# Patient Record
Sex: Male | Born: 1955 | Race: White | Hispanic: No | Marital: Married | Smoking: Former smoker
Health system: Southern US, Community
[De-identification: ages and names within clinical notes are randomized; demographics above are authoritative.]

## PROBLEM LIST (undated history)

## (undated) DIAGNOSIS — I10 Essential (primary) hypertension: Secondary | ICD-10-CM

## (undated) DIAGNOSIS — G4700 Insomnia, unspecified: Secondary | ICD-10-CM

## (undated) DIAGNOSIS — E039 Hypothyroidism, unspecified: Secondary | ICD-10-CM

## (undated) DIAGNOSIS — E7800 Pure hypercholesterolemia, unspecified: Secondary | ICD-10-CM

## (undated) DIAGNOSIS — N400 Enlarged prostate without lower urinary tract symptoms: Secondary | ICD-10-CM

## (undated) HISTORY — DX: Insomnia, unspecified: G47.00

## (undated) HISTORY — DX: Benign prostatic hyperplasia without lower urinary tract symptoms: N40.0

## (undated) HISTORY — DX: Essential (primary) hypertension: I10

## (undated) HISTORY — DX: Hypothyroidism, unspecified: E03.9

## (undated) HISTORY — DX: Pure hypercholesterolemia, unspecified: E78.00

## (undated) HISTORY — PX: OTHER SURGICAL HISTORY: SHX169

## (undated) HISTORY — PX: INCISION AND DRAINAGE ABSCESS ANAL: SUR669

---

## 1988-12-27 HISTORY — PX: BACK SURGERY: SHX140

## 2007-02-14 ENCOUNTER — Ambulatory Visit (HOSPITAL_COMMUNITY): Admission: RE | Admit: 2007-02-14 | Discharge: 2007-02-14 | Payer: Self-pay | Admitting: Family Medicine

## 2008-02-06 ENCOUNTER — Ambulatory Visit (HOSPITAL_COMMUNITY): Admission: RE | Admit: 2008-02-06 | Discharge: 2008-02-06 | Payer: Self-pay | Admitting: General Surgery

## 2011-05-11 NOTE — H&P (Signed)
NAME:  Arthur Williamson, Arthur Williamson NO.:  [id number]   MEDICAL RECORD NO.:  [id number]           PATIENT TYPE:  AMB   LOCATION:  DAY                           FACILITY:  [facility]   PHYSICIAN:  Dalia Heading, M.D.  DATE OF BIRTH:  [birth year]   DATE OF ADMISSION:  DATE OF DISCHARGE:  LH                              HISTORY & PHYSICAL   CHIEF COMPLAINT:  Need for screening colonoscopy.   HISTORY OF PRESENT ILLNESS:  The patient is a 55 year old white male who  is referred for endoscopic evaluation.  Needs colonoscopy for screening  purposes.  No abdominal pain, weight loss, nausea, vomiting, diarrhea,  constipation, melena, hematochezia have been noted.  He has never had a  colonoscopy.  There is no family history of colon carcinoma.   PAST MEDICAL HISTORY:  Includes hypertension.   PAST SURGICAL HISTORY:  Pyloric stenosis, disk surgery, perianal  abscess.   CURRENT MEDICATIONS:  Benicar, simvastatin, Celebrex.   ALLERGIES:  No known drug allergies.   REVIEW OF SYSTEMS:  Noncontributory.   PHYSICAL EXAMINATION:  The patient is a well-developed, well-nourished,  white male in no acute distress.  LUNGS:  Clear to auscultation with equal breath sounds bilaterally.  HEART:  Examination reveals regular rate and rhythm without S3, S4, or  murmurs.  ABDOMEN:  Is soft, nontender, nondistended.  No hepatosplenomegaly or  masses are noted.  RECTAL EXAMINATION:  Was deferred to the procedure.   IMPRESSION:  Need for screening colonoscopy.   PLAN:  The patient is scheduled for colonoscopy on February 06, 2008.  Risks and benefits of the procedure including bleeding and perforation  were fully explained to the patient, who gave informed consent.      Dalia Heading, M.D.  Electronically Signed     MAJ/MEDQ  D:  01/25/2008  T:  01/25/2008  Job:  [id number]   cc:   Patrica Duel, M.D.  Fax: [phone]   Dalia Heading, M.D.  Fax: [phone]   [facility]  Fax:  [phone]

## 2019-03-08 ENCOUNTER — Encounter: Payer: Self-pay | Admitting: Internal Medicine

## 2019-05-01 DIAGNOSIS — Z1389 Encounter for screening for other disorder: Secondary | ICD-10-CM | POA: Diagnosis not present

## 2019-05-01 DIAGNOSIS — E063 Autoimmune thyroiditis: Secondary | ICD-10-CM | POA: Diagnosis not present

## 2019-05-01 DIAGNOSIS — Z6832 Body mass index (BMI) 32.0-32.9, adult: Secondary | ICD-10-CM | POA: Diagnosis not present

## 2019-05-01 DIAGNOSIS — E6609 Other obesity due to excess calories: Secondary | ICD-10-CM | POA: Diagnosis not present

## 2019-05-14 ENCOUNTER — Ambulatory Visit (INDEPENDENT_AMBULATORY_CARE_PROVIDER_SITE_OTHER): Payer: Medicare Other | Admitting: Nurse Practitioner

## 2019-05-14 ENCOUNTER — Other Ambulatory Visit: Payer: Self-pay

## 2019-05-14 ENCOUNTER — Telehealth: Payer: Self-pay | Admitting: *Deleted

## 2019-05-14 ENCOUNTER — Encounter: Payer: Self-pay | Admitting: Nurse Practitioner

## 2019-05-14 DIAGNOSIS — Z1211 Encounter for screening for malignant neoplasm of colon: Secondary | ICD-10-CM | POA: Diagnosis not present

## 2019-05-14 DIAGNOSIS — Z0000 Encounter for general adult medical examination without abnormal findings: Secondary | ICD-10-CM | POA: Insufficient documentation

## 2019-05-14 NOTE — Telephone Encounter (Signed)
LMOVM to schedule TCS w/ RMR with propofol in July per pt request

## 2019-05-14 NOTE — Patient Instructions (Signed)
Your health issues we discussed today were:   Need for colonoscopy: 1. We will schedule your colonoscopy for you.  I will let the nursing staff know you prefer to wait till July 2. As we discussed you will be tested for COVID-19/coronavirus prior to your procedure. 3. After your COVID-19/coronavirus testing you will likely need to stay at home until your procedure 4. Further recommendations will follow your colonoscopy  Overall I recommend:  1. Call us if you have any questions or concerns 2. Return for follow-up based on recommendations made after your colonoscopy, or as needed for GI problems 3. Continue your other medications.   Because of recent events of COVID-19 ("Coronavirus"), follow CDC recommendations:  1. Wash your hand frequently 2. Avoid touching your face 3. Stay away from people who are sick 4. If you have symptoms such as fever, cough, shortness of breath then call your healthcare provider for further guidance 5. If you are sick, STAY AT HOME unless otherwise directed by your healthcare provider. 6. Follow directions from state and national officials regarding staying safe   At [facility] we value your feedback. You may receive a survey about your visit today. Please share your experience as we strive to create trusting relationships with our patients to provide genuine, compassionate, quality care.  We appreciate your understanding and patience as we review any laboratory studies, imaging, and other diagnostic tests that are ordered as we care for you. Our office policy is 5 business days for review of these results, and any emergent or urgent results are addressed in a timely manner for your best interest. If you do not hear from our office in 1 week, please contact us.   We also encourage the use of MyChart, which contains your medical information for your review as well. If you are not enrolled in this feature, an access code is on this after visit  summary for your convenience. Thank you for allowing [place] to be involved in your care.  It was great to see you today!  I hope you have a great day!!

## 2019-05-14 NOTE — Assessment & Plan Note (Addendum)
The patient was referred by primary care for colonoscopy.  His last colonoscopy was 12 years ago and recommended 10-year repeat.  He has held off due to wanting to wait till Medicare eligibility.  He is currently asymptomatic from GI standpoint.  We will proceed with colonoscopy at this time.  I discussed with him COVID-19 testing prior to his procedure and the likely need for him to quarantine.  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  Given backlog of elective cases it should not be an issue.  Return for follow-up based on recommendations made after his procedure.  Proceed with TCS on propofol/MAC with Dr. Gala Romney in near future: the risks, benefits, and alternatives have been discussed with the patient in detail. The patient states understanding and desires to proceed.  The patient is currently on Xanax.  No other anticoagulants, anxiolytics, chronic pain medications, or antidepressants.  He drinks about 6 beers a day and smokes marijuana once a day.  Given his alcohol and marijuana use as well as his daily Xanax we will plan for the procedure on propofol/MAC to promote adequate sedation.

## 2019-05-14 NOTE — Progress Notes (Signed)
Referring Provider: Redmond School, MD Primary Care Physician:  Redmond School, MD Primary GI:  Dr. Gala Romney  NOTE: Service was provided via telemedicine and was requested by the patient due to COVID-19 pandemic.  Method of visit: Doxy.Me  Patient Location: Home  Provider Location: Office  Reason for Phone Visit: PCP Referral  The patient was consented to phone follow-up via telephone encounter including billing of the encounter (yes/no): Yes  Persons present on the phone encounter, with roles: Wife  Total time (minutes) spent on medical discussion: 18 minutes  Chief Complaint  Patient presents with  . Colonoscopy    last tcs approx 12 yrs ago by Dr. Arnoldo Morale; doing ok    HPI:   Arthur Williamson is a 63 y.o. male who presents for virtual visit regarding: On referral from PCP for colonoscopy.  Nurse/phone triage was deferred due to medications likely necessitating augmented sedation.  No history of colonoscopy found in our system.  Today he states he is doing well overall.  His last colonoscopy was 12 years ago by Dr. Arnoldo Morale with surgery.  Recommended 10-year repeat colonoscopy but he put it off waiting until he became Medicare eligible.  Denies abdominal pain, N/V, hematochezia, melena, fever, chills, unintentional weight loss.  Denies URI and flu-like symptoms.  Denies loss of sense of taste or smell. Denies chest pain, dyspnea, dizziness, lightheadedness, syncope, near syncope. Denies any other upper or lower GI symptoms.   Past Medical History:  Diagnosis Date  . BPH (benign prostatic hyperplasia)   . Hypercholesterolemia   . Hypertension   . Hypothyroidism   . Insomnia     Past Surgical History:  Procedure Laterality Date  . BACK SURGERY  1990  . INCISION AND DRAINAGE ABSCESS ANAL     perianal abscess drainage  . PYLORIC VALVE SURGERY     As an infant    Current Outpatient Medications  Medication Sig Dispense Refill  . ALPRAZolam (XANAX) 0.5 MG tablet  Take 1 tablet by mouth at bedtime.    Marland Kitchen levothyroxine (SYNTHROID) 75 MCG tablet Take 75 mcg by mouth daily before breakfast.    . losartan (COZAAR) 100 MG tablet Take 1 tablet by mouth daily.    . simvastatin (ZOCOR) 20 MG tablet Take 1 tablet by mouth daily.    . tamsulosin (FLOMAX) 0.4 MG CAPS capsule Take 1 capsule by mouth daily.     No current facility-administered medications for this visit.     Allergies as of 05/14/2019  . (No Known Allergies)    Family History  Problem Relation Age of Onset  . Colon cancer Neg Hx     Social History   Socioeconomic History  . Marital status: Married    Spouse name: Not on file  . Number of children: Not on file  . Years of education: Not on file  . Highest education level: Not on file  Occupational History  . Not on file  Social Needs  . Financial resource strain: Not on file  . Food insecurity:    Worry: Not on file    Inability: Not on file  . Transportation needs:    Medical: Not on file    Non-medical: Not on file  Tobacco Use  . Smoking status: Former Smoker    Types: Cigarettes    Start date: 12/27/1969    Last attempt to quit: 05/13/1994    Years since quitting: 25.0  . Smokeless tobacco: Never Used  Substance and Sexual Activity  .  Alcohol use: Yes    Comment: 6 beers/day  . Drug use: Yes    Frequency: 7.0 times per week    Types: Marijuana    Comment: daily  . Sexual activity: Not on file  Lifestyle  . Physical activity:    Days per week: Not on file    Minutes per session: Not on file  . Stress: Not on file  Relationships  . Social connections:    Talks on phone: Not on file    Gets together: Not on file    Attends religious service: Not on file    Active member of club or organization: Not on file    Attends meetings of clubs or organizations: Not on file    Relationship status: Not on file  Other Topics Concern  . Not on file  Social History Narrative  . Not on file    Review of Systems: General:  Negative for anorexia, weight loss, fever, chills, fatigue, weakness. ENT: Negative for hoarseness, difficulty swallowing. CV: Negative for chest pain, angina, palpitations, peripheral edema.  Respiratory: Negative for dyspnea at rest, cough, sputum, wheezing.  GI: See history of present illness. MS: Negative for joint pain, low back pain.  Derm: Negative for rash or itching.  Endo: Negative for unusual weight change.  Heme: Negative for bruising or bleeding. Allergy: Negative for rash or hives.  Physical Exam: Note: limited exam due to virtual visit General:   Alert and oriented. Pleasant and cooperative. Well-nourished and well-developed.  Head:  Normocephalic and atraumatic. Eyes:  Without icterus, sclera clear and conjunctiva pink.  Ears:  Normal auditory acuity. Skin:  Intact without facial significant lesions or rashes. Neurologic:  Alert and oriented x4;  grossly normal neurologically. Psych:  Alert and cooperative. Normal mood and affect. Heme/Lymph/Immune: No excessive bruising noted.

## 2019-05-15 NOTE — Telephone Encounter (Signed)
Called patient to schedule procedure. He is requesting early AM appt and as of right now we only have afternoon appts. Patient is aware we will call to schedule once we receive august schedule. He was fine with that

## 2019-05-15 NOTE — Progress Notes (Signed)
CC'D TO PCP °

## 2019-05-28 ENCOUNTER — Telehealth: Payer: Self-pay | Admitting: *Deleted

## 2019-05-28 NOTE — Telephone Encounter (Signed)
Attempted to call patient to schedule TCS with propofol with RMR. Patient home # and received busy signal x 3. Called mobile and line d/c's after 1 ring. I have mailed patient letter to call.

## 2019-05-31 DIAGNOSIS — E039 Hypothyroidism, unspecified: Secondary | ICD-10-CM | POA: Diagnosis not present

## 2019-06-04 ENCOUNTER — Other Ambulatory Visit: Payer: Self-pay

## 2019-06-04 ENCOUNTER — Telehealth: Payer: Self-pay | Admitting: Internal Medicine

## 2019-06-04 DIAGNOSIS — Z1211 Encounter for screening for malignant neoplasm of colon: Secondary | ICD-10-CM

## 2019-06-04 MED ORDER — PEG 3350-KCL-NA BICARB-NACL 420 G PO SOLR: 4000.0000 mL | ORAL | 0 refills | Status: DC

## 2019-06-04 NOTE — Telephone Encounter (Signed)
Pre-op appt 08/20/19 at 10:00am. Letter mailed with procedure instructions.

## 2019-06-04 NOTE — Telephone Encounter (Signed)
Called pt, TCS w/Propofol w/RMR scheduled for 08/23/19 at 8:30am. Rx for prep sent to pharmacy. Orders entered.

## 2019-06-04 NOTE — Telephone Encounter (Signed)
Pt had DOXY back in May and was calling to schedule his colonoscopy. I told him the scheduler would have to call him back and he said he was at work and might not be able to answer, but to [facility]. [phone]

## 2019-07-11 DIAGNOSIS — Z0120 Encounter for dental examination and cleaning without abnormal findings: Secondary | ICD-10-CM | POA: Diagnosis not present

## 2019-08-14 DIAGNOSIS — Z6832 Body mass index (BMI) 32.0-32.9, adult: Secondary | ICD-10-CM | POA: Diagnosis not present

## 2019-08-14 DIAGNOSIS — E6609 Other obesity due to excess calories: Secondary | ICD-10-CM | POA: Diagnosis not present

## 2019-08-14 DIAGNOSIS — Z1389 Encounter for screening for other disorder: Secondary | ICD-10-CM | POA: Diagnosis not present

## 2019-08-14 DIAGNOSIS — F419 Anxiety disorder, unspecified: Secondary | ICD-10-CM | POA: Diagnosis not present

## 2019-08-14 DIAGNOSIS — Z23 Encounter for immunization: Secondary | ICD-10-CM | POA: Diagnosis not present

## 2019-08-14 DIAGNOSIS — Z0000 Encounter for general adult medical examination without abnormal findings: Secondary | ICD-10-CM | POA: Diagnosis not present

## 2019-08-17 ENCOUNTER — Encounter (HOSPITAL_COMMUNITY): Payer: Self-pay

## 2019-08-17 ENCOUNTER — Other Ambulatory Visit: Payer: Self-pay

## 2019-08-20 ENCOUNTER — Encounter (HOSPITAL_COMMUNITY)
Admission: RE | Admit: 2019-08-20 | Discharge: 2019-08-20 | Disposition: A | Discharge status: Home / Self Care | Payer: Medicare Other | Source: Ambulatory Visit | Attending: Internal Medicine | Admitting: Internal Medicine

## 2019-08-20 ENCOUNTER — Other Ambulatory Visit: Payer: Self-pay

## 2019-08-21 ENCOUNTER — Other Ambulatory Visit (HOSPITAL_COMMUNITY)
Admission: RE | Admit: 2019-08-21 | Discharge: 2019-08-21 | Disposition: A | Discharge status: Home / Self Care | Payer: Medicare Other | Source: Ambulatory Visit | Attending: Internal Medicine | Admitting: Internal Medicine

## 2019-08-21 ENCOUNTER — Other Ambulatory Visit: Payer: Self-pay

## 2019-08-21 DIAGNOSIS — Z20828 Contact with and (suspected) exposure to other viral communicable diseases: Secondary | ICD-10-CM | POA: Insufficient documentation

## 2019-08-21 DIAGNOSIS — Z01812 Encounter for preprocedural laboratory examination: Secondary | ICD-10-CM | POA: Insufficient documentation

## 2019-08-21 LAB — SARS CORONAVIRUS 2 (TAT 6-24 HRS): SARS Coronavirus 2: NEGATIVE

## 2019-08-23 ENCOUNTER — Encounter (HOSPITAL_COMMUNITY)
Admission: RE | Disposition: A | Discharge status: Home / Self Care | Payer: Self-pay | Source: Home / Self Care | Attending: Internal Medicine

## 2019-08-23 ENCOUNTER — Other Ambulatory Visit: Payer: Self-pay

## 2019-08-23 ENCOUNTER — Ambulatory Visit (HOSPITAL_COMMUNITY): Payer: Medicare Other | Admitting: Anesthesiology

## 2019-08-23 ENCOUNTER — Ambulatory Visit (HOSPITAL_COMMUNITY)
Admission: RE | Admit: 2019-08-23 | Discharge: 2019-08-23 | Disposition: A | Discharge status: Home / Self Care | Payer: Medicare Other | Attending: Internal Medicine | Admitting: Internal Medicine

## 2019-08-23 ENCOUNTER — Encounter (HOSPITAL_COMMUNITY): Payer: Self-pay | Admitting: *Deleted

## 2019-08-23 DIAGNOSIS — N400 Enlarged prostate without lower urinary tract symptoms: Secondary | ICD-10-CM | POA: Diagnosis not present

## 2019-08-23 DIAGNOSIS — E7800 Pure hypercholesterolemia, unspecified: Secondary | ICD-10-CM | POA: Insufficient documentation

## 2019-08-23 DIAGNOSIS — Z79899 Other long term (current) drug therapy: Secondary | ICD-10-CM | POA: Diagnosis not present

## 2019-08-23 DIAGNOSIS — Z1211 Encounter for screening for malignant neoplasm of colon: Secondary | ICD-10-CM | POA: Insufficient documentation

## 2019-08-23 DIAGNOSIS — G4730 Sleep apnea, unspecified: Secondary | ICD-10-CM | POA: Insufficient documentation

## 2019-08-23 DIAGNOSIS — Z87891 Personal history of nicotine dependence: Secondary | ICD-10-CM | POA: Diagnosis not present

## 2019-08-23 DIAGNOSIS — K635 Polyp of colon: Secondary | ICD-10-CM | POA: Insufficient documentation

## 2019-08-23 DIAGNOSIS — D125 Benign neoplasm of sigmoid colon: Secondary | ICD-10-CM | POA: Insufficient documentation

## 2019-08-23 DIAGNOSIS — I10 Essential (primary) hypertension: Secondary | ICD-10-CM | POA: Diagnosis not present

## 2019-08-23 DIAGNOSIS — E039 Hypothyroidism, unspecified: Secondary | ICD-10-CM | POA: Diagnosis not present

## 2019-08-23 DIAGNOSIS — K5730 Diverticulosis of large intestine without perforation or abscess without bleeding: Secondary | ICD-10-CM | POA: Diagnosis not present

## 2019-08-23 HISTORY — PX: POLYPECTOMY: SHX5525

## 2019-08-23 HISTORY — PX: COLONOSCOPY WITH PROPOFOL: SHX5780

## 2019-08-23 SURGERY — COLONOSCOPY WITH PROPOFOL
Anesthesia: General

## 2019-08-23 MED ORDER — PROPOFOL 10 MG/ML IV BOLUS
INTRAVENOUS | Status: AC
  Filled 2019-08-23: qty 40

## 2019-08-23 MED ORDER — KETAMINE HCL 10 MG/ML IJ SOLN
INTRAMUSCULAR | Status: DC | PRN
  Administered 2019-08-23: 10 mg via INTRAVENOUS
  Administered 2019-08-23 (×2): 5 mg via INTRAVENOUS

## 2019-08-23 MED ORDER — PROPOFOL 500 MG/50ML IV EMUL
INTRAVENOUS | Status: DC | PRN
  Administered 2019-08-23: 150 ug/kg/min via INTRAVENOUS

## 2019-08-23 MED ORDER — LACTATED RINGERS IV SOLN
INTRAVENOUS | Status: DC | PRN
  Administered 2019-08-23: 07:00:00 via INTRAVENOUS

## 2019-08-23 MED ORDER — CHLORHEXIDINE GLUCONATE CLOTH 2 % EX PADS: 6.0000 | MEDICATED_PAD | Freq: Once | CUTANEOUS | Status: DC

## 2019-08-23 MED ORDER — MIDAZOLAM HCL 5 MG/5ML IJ SOLN
INTRAMUSCULAR | Status: DC | PRN
  Administered 2019-08-23: 2 mg via INTRAVENOUS

## 2019-08-23 MED ORDER — MIDAZOLAM HCL 2 MG/2ML IJ SOLN
INTRAMUSCULAR | Status: AC
  Filled 2019-08-23: qty 2

## 2019-08-23 MED ORDER — PROPOFOL 10 MG/ML IV BOLUS
INTRAVENOUS | Status: DC | PRN
  Administered 2019-08-23 (×3): 20 mg via INTRAVENOUS
  Administered 2019-08-23 (×2): 10 mg via INTRAVENOUS
  Administered 2019-08-23: 20 mg via INTRAVENOUS
  Administered 2019-08-23: 30 mg via INTRAVENOUS
  Administered 2019-08-23: 20 mg via INTRAVENOUS

## 2019-08-23 MED ORDER — LACTATED RINGERS IV SOLN
Freq: Once | INTRAVENOUS | Status: AC
  Administered 2019-08-23: 07:00:00 1000 mL via INTRAVENOUS

## 2019-08-23 MED ORDER — KETAMINE HCL 50 MG/5ML IJ SOSY
PREFILLED_SYRINGE | INTRAMUSCULAR | Status: AC
  Filled 2019-08-23: qty 5

## 2019-08-23 NOTE — Discharge Instructions (Signed)
Colonoscopy Discharge Instructions  Read the instructions outlined below and refer to this sheet in the next few weeks. These discharge instructions provide you with general information on caring for yourself after you leave the hospital. Your doctor may also give you specific instructions. While your treatment has been planned according to the most current medical practices available, unavoidable complications occasionally occur. If you have any problems or questions after discharge, call Dr. Gala Romney at [phone]. ACTIVITY  You may resume your regular activity, but move at a slower pace for the next 24 hours.   Take frequent rest periods for the next 24 hours.   Walking will help get rid of the air and reduce the bloated feeling in your belly (abdomen).   No driving for 24 hours (because of the medicine (anesthesia) used during the test).    Do not sign any important legal documents or operate any machinery for 24 hours (because of the anesthesia used during the test).  NUTRITION  Drink plenty of fluids.   You may resume your normal diet as instructed by your doctor.   Begin with a light meal and progress to your normal diet. Heavy or fried foods are harder to digest and may make you feel sick to your stomach (nauseated).   Avoid alcoholic beverages for 24 hours or as instructed.  MEDICATIONS  You may resume your normal medications unless your doctor tells you otherwise.  WHAT YOU CAN EXPECT TODAY  Some feelings of bloating in the abdomen.   Passage of more gas than usual.   Spotting of blood in your stool or on the toilet paper.  IF YOU HAD POLYPS REMOVED DURING THE COLONOSCOPY:  No aspirin products for 7 days or as instructed.   No alcohol for 7 days or as instructed.   Eat a soft diet for the next 24 hours.  FINDING OUT THE RESULTS OF YOUR TEST Not all test results are available during your visit. If your test results are not back during the visit, make an appointment  with your caregiver to find out the results. Do not assume everything is normal if you have not heard from your caregiver or the medical facility. It is important for you to follow up on all of your test results.  SEEK IMMEDIATE MEDICAL ATTENTION IF:  You have more than a spotting of blood in your stool.   Your belly is swollen (abdominal distention).   You are nauseated or vomiting.   You have a temperature over 101.   You have abdominal pain or discomfort that is severe or gets worse throughout the day.    Colon Polyps  Polyps are tissue growths inside the body. Polyps can grow in many places, including the large intestine (colon). A polyp may be a round bump or a mushroom-shaped growth. You could have one polyp or several. Most colon polyps are noncancerous (benign). However, some colon polyps can become cancerous over time. Finding and removing the polyps early can help prevent this. What are the causes? The exact cause of colon polyps is not known. What increases the risk? You are more likely to develop this condition if you:  Have a family history of colon cancer or colon polyps.  Are older than 94 or older than 45 if you are African American.  Have inflammatory bowel disease, such as ulcerative colitis or Crohn's disease.  Have certain hereditary conditions, such as: ? Familial adenomatous polyposis. ? Lynch syndrome. ? Turcot syndrome. ? Peutz-Jeghers syndrome.  Are  overweight.  Smoke cigarettes.  Do not get enough exercise.  Drink too much alcohol.  Eat a diet that is high in fat and red meat and low in fiber.  Had childhood cancer that was treated with abdominal radiation. What are the signs or symptoms? Most polyps do not cause symptoms. If you have symptoms, they may include:  Blood coming from your rectum when having a bowel movement.  Blood in your stool. The stool may look dark red or black.  Abdominal pain.  A change in bowel habits, such as  constipation or diarrhea. How is this diagnosed? This condition is diagnosed with a colonoscopy. This is a procedure in which a lighted, flexible scope is inserted into the anus and then passed into the colon to examine the area. Polyps are sometimes found when a colonoscopy is done as part of routine cancer screening tests. How is this treated? Treatment for this condition involves removing any polyps that are found. Most polyps can be removed during a colonoscopy. Those polyps will then be tested for cancer. Additional treatment may be needed depending on the results of testing. Follow these instructions at home: Lifestyle  Maintain a healthy weight, or lose weight if recommended by your health care provider.  Exercise every day or as told by your health care provider.  Do not use any products that contain nicotine or tobacco, such as cigarettes and e-cigarettes. If you need help quitting, ask your health care provider.  If you drink alcohol, limit how much you have: ? 0-1 drink a day for women. ? 0-2 drinks a day for men.  Be aware of how much alcohol is in your drink. In the [place] one drink equals one 12 oz bottle of beer (355 mL), one 5 oz glass of wine (148 mL), or one 1 oz shot of hard liquor (44 mL). Eating and drinking   Eat foods that are high in fiber, such as fruits, vegetables, and whole grains.  Eat foods that are high in calcium and vitamin D, such as milk, cheese, yogurt, eggs, liver, fish, and broccoli.  Limit foods that are high in fat, such as fried foods and desserts.  Limit the amount of red meat and processed meat you eat, such as hot dogs, sausage, bacon, and lunch meats. General instructions  Keep all follow-up visits as told by your health care provider. This is important. ? This includes having regularly scheduled colonoscopies. ? Talk to your health care provider about when you need a colonoscopy. Contact a health care provider if:  You have new or  worsening bleeding during a bowel movement.  You have new or increased blood in your stool.  You have a change in bowel habits.  You lose weight for no known reason. Summary  Polyps are tissue growths inside the body. Polyps can grow in many places, including the colon.  Most colon polyps are noncancerous (benign), but some can become cancerous over time.  This condition is diagnosed with a colonoscopy.  Treatment for this condition involves removing any polyps that are found. Most polyps can be removed during a colonoscopy. This information is not intended to replace advice given to you by your health care provider. Make sure you discuss any questions you have with your health care provider. Document Released: 09/08/2004 Document Revised: 03/30/2018 Document Reviewed: 03/30/2018 Elsevier Patient Education  [place].  Diverticulosis  Diverticulosis is a condition that develops when small pouches (diverticula) form in the wall of the large  intestine (colon). The colon is where water is absorbed and stool is formed. The pouches form when the inside layer of the colon pushes through weak spots in the outer layers of the colon. You may have a few pouches or many of them. What are the causes? The cause of this condition is not known. What increases the risk? The following factors may make you more likely to develop this condition:  Being older than age 68. Your risk for this condition increases with age. Diverticulosis is rare among people younger than age 52. By age 53, many people have it.  Eating a low-fiber diet.  Having frequent constipation.  Being overweight.  Not getting enough exercise.  Smoking.  Taking over-the-counter pain medicines, like aspirin and ibuprofen.  Having a family history of diverticulosis. What are the signs or symptoms? In most people, there are no symptoms of this condition. If you do have symptoms, they may include:  Bloating.  Cramps  in the abdomen.  Constipation or diarrhea.  Pain in the lower left side of the abdomen. How is this diagnosed? This condition is most often diagnosed during an exam for other colon problems. Because diverticulosis usually has no symptoms, it often cannot be diagnosed independently. This condition may be diagnosed by:  Using a flexible scope to examine the colon (colonoscopy).  Taking an X-ray of the colon after dye has been put into the colon (barium enema).  Doing a CT scan. How is this treated? You may not need treatment for this condition if you have never developed an infection related to diverticulosis. If you have had an infection before, treatment may include:  Eating a high-fiber diet. This may include eating more fruits, vegetables, and grains.  Taking a fiber supplement.  Taking a live bacteria supplement (probiotic).  Taking medicine to relax your colon.  Taking antibiotic medicines. Follow these instructions at home:  Drink 6-8 glasses of water or more each day to prevent constipation.  Try not to strain when you have a bowel movement.  If you have had an infection before: ? Eat more fiber as directed by your health care provider or your diet and nutrition specialist (dietitian). ? Take a fiber supplement or probiotic, if your health care provider approves.  Take over-the-counter and prescription medicines only as told by your health care provider.  If you were prescribed an antibiotic, take it as told by your health care provider. Do not stop taking the antibiotic even if you start to feel better.  Keep all follow-up visits as told by your health care provider. This is important. Contact a health care provider if:  You have pain in your abdomen.  You have bloating.  You have cramps.  You have not had a bowel movement in 3 days. Get help right away if:  Your pain gets worse.  Your bloating becomes very bad.  You have a fever or chills, and your  symptoms suddenly get worse.  You vomit.  You have bowel movements that are bloody or black.  You have bleeding from your rectum. Summary  Diverticulosis is a condition that develops when small pouches (diverticula) form in the wall of the large intestine (colon).  You may have a few pouches or many of them.  This condition is most often diagnosed during an exam for other colon problems.  If you have had an infection related to diverticulosis, treatment may include increasing the fiber in your diet, taking supplements, or taking medicines. This information is  not intended to replace advice given to you by your health care provider. Make sure you discuss any questions you have with your health care provider. Document Released: 09/09/2004 Document Revised: 11/25/2017 Document Reviewed: 11/01/2016 Elsevier Patient Education  2020 [place] with prescribing physician regarding dose of losartan.  What is in our chart is incorrect  Colon polyp and diverticulosis information provided  Further recommendations to follow pending review of pathology report  At patient's request I spoke to Margarita Grizzle, patient's wife, at [phone]

## 2019-08-23 NOTE — H&P (Signed)
@LOGO @   Primary Care Physician:  Redmond School, MD Primary Gastroenterologist:  Dr. Gala Romney  Pre-Procedure History & Physical: HPI:  Arthur Williamson is a 63 y.o. male is here for a screening colonoscopy.  Negative colonoscopy reportedly 12 years ago.  No bowel symptoms.  Past Medical History:  Diagnosis Date  . BPH (benign prostatic hyperplasia)   . Hypercholesterolemia   . Hypertension   . Hypothyroidism   . Insomnia     Past Surgical History:  Procedure Laterality Date  . BACK SURGERY  1990  . INCISION AND DRAINAGE ABSCESS ANAL     perianal abscess drainage  . PYLORIC VALVE SURGERY     As an infant    Prior to Admission medications   Medication Sig Start Date End Date Taking? Authorizing Provider  ALPRAZolam Duanne Moron) 0.5 MG tablet Take 0.5-1 mg by mouth at bedtime as needed for sleep.    Yes [provider]  levothyroxine (SYNTHROID) 75 MCG tablet Take 75 mcg by mouth daily before breakfast.   Yes [provider]  loratadine (CLARITIN) 10 MG tablet Take 10 mg by mouth daily as needed for allergies.   Yes [provider]  losartan (COZAAR) 100 MG tablet Take 100 tablets by mouth at bedtime.  03/12/19  Yes [provider]  polyethylene glycol-electrolytes (TRILYTE) 420 g solution Take 4,000 mLs by mouth as directed. 06/04/19  Yes Abu Heavin, Cristopher Estimable, MD  simvastatin (ZOCOR) 20 MG tablet Take 20 mg by mouth at bedtime.  04/30/19  Yes [provider]  tamsulosin (FLOMAX) 0.4 MG CAPS capsule Take 0.4 mg by mouth at bedtime.  03/07/19  Yes [provider]    Allergies as of 06/04/2019  . (No Known Allergies)    Family History  Problem Relation Age of Onset  . Colon cancer Neg Hx     Social History   Socioeconomic History  . Marital status: Married    Spouse name: Not on file  . Number of children: Not on file  . Years of education: Not on file  . Highest education level: Not on file  Occupational History  . Not on file   Social Needs  . Financial resource strain: Not on file  . Food insecurity    Worry: Not on file    Inability: Not on file  . Transportation needs    Medical: Not on file    Non-medical: Not on file  Tobacco Use  . Smoking status: Former Smoker    Packs/day: 1.00    Years: 25.00    Pack years: 25.00    Types: Cigarettes    Start date: 12/27/1969    Quit date: 05/13/1994    Years since quitting: 25.2  . Smokeless tobacco: Never Used  Substance and Sexual Activity  . Alcohol use: Yes    Comment: 6 beers/day  . Drug use: Yes    Frequency: 7.0 times per week    Types: Marijuana    Comment: daily  . Sexual activity: Yes  Lifestyle  . Physical activity    Days per week: Not on file    Minutes per session: Not on file  . Stress: Not on file  Relationships  . Social Herbalist on phone: Not on file    Gets together: Not on file    Attends religious service: Not on file    Active member of club or organization: Not on file    Attends meetings of clubs or organizations: Not  on file    Relationship status: Not on file  . Intimate partner violence    Fear of current or ex partner: Not on file    Emotionally abused: Not on file    Physically abused: Not on file    Forced sexual activity: Not on file  Other Topics Concern  . Not on file  Social History Narrative  . Not on file    Review of Systems: See HPI, otherwise negative ROS  Physical Exam: BP (!) 144/87   Temp 98.3 F (36.8 C) (Oral)   Resp 18   Ht 5\' 10"  (1.778 m)   Wt 97.5 kg   SpO2 96%   BMI 30.85 kg/m  General:   Alert,  Well-developed, well-nourished, pleasant and cooperative in NAD Neck:  Supple; no masses or thyromegaly. Lungs:  Clear throughout to auscultation.   No wheezes, crackles, or rhonchi. No acute distress. Heart:  Regular rate and rhythm; no murmurs, clicks, rubs,  or gallops. Abdomen:  Soft, nontender and nondistended. No masses, hepatosplenomegaly or hernias noted. Normal bowel  sounds, without guarding, and without rebound.    Impression/Plan: Arthur Williamson is now here to undergo a screening colonoscopy.  Average rescreening colonoscopy.  Risks, benefits, limitations, imponderables and alternatives regarding colonoscopy have been reviewed with the patient. Questions have been answered. All parties agreeable.     Notice:  This dictation was prepared with Dragon dictation along with smaller phrase technology. Any transcriptional errors that result from this process are unintentional and may not be corrected upon review.

## 2019-08-23 NOTE — Anesthesia Postprocedure Evaluation (Signed)
Anesthesia Post Note  Patient: Arthur Williamson  Procedure(s) Performed: COLONOSCOPY WITH PROPOFOL (N/A )  Patient location during evaluation: PACU Anesthesia Type: General Level of consciousness: awake and alert Pain management: pain level controlled Vital Signs Assessment: post-procedure vital signs reviewed and stable Respiratory status: spontaneous breathing Cardiovascular status: blood pressure returned to baseline and stable Postop Assessment: no apparent nausea or vomiting Anesthetic complications: no     Last Vitals:  Vitals:   08/23/19 0730 08/23/19 0806  BP: (!) 158/93 (P) 112/64  Resp:  (P) 18  Temp:  (P) 36.7 C  SpO2:  (P) 97%    Last Pain:  Vitals:   08/23/19 0806  TempSrc:   PainSc: (P) 1                  Parlee Amescua

## 2019-08-23 NOTE — Transfer of Care (Signed)
Immediate Anesthesia Transfer of Care Note  Patient: Arthur Williamson  Procedure(s) Performed: COLONOSCOPY WITH PROPOFOL (N/A )  Patient Location: PACU  Anesthesia Type:General  Level of Consciousness: awake and alert   Airway & Oxygen Therapy: Patient Spontanous Breathing  Post-op Assessment: Report given to RN  Post vital signs: Reviewed and stable  Last Vitals:  Vitals Value Taken Time  BP    Temp    Pulse    Resp    SpO2      Last Pain:  Vitals:   08/23/19 0806  TempSrc:   PainSc: (P) 1       Patients Stated Pain Goal: (P) 8 [id number])  Complications: No apparent anesthesia complications

## 2019-08-23 NOTE — Anesthesia Preprocedure Evaluation (Signed)
Anesthesia Evaluation  Patient identified by MRN, date of birth, ID band Patient awake    Reviewed: Allergy & Precautions, NPO status , Patient's Chart, lab work & pertinent test results  Airway Mallampati: III  TM Distance: >3 FB Neck ROM: Full    Dental  (+) Caps   Pulmonary Sleep apnea: snoring. , former smoker,           Cardiovascular hypertension, Pt. on medications Normal cardiovascular exam     Neuro/Psych    GI/Hepatic GERD (mild)  ,  Endo/Other  Hypothyroidism   Renal/GU      Musculoskeletal   Abdominal   Peds  Hematology   Anesthesia Other Findings   Reproductive/Obstetrics                             Anesthesia Physical Anesthesia Plan  ASA: II  Anesthesia Plan: General   Post-op Pain Management:    Induction: Intravenous  PONV Risk Score and Plan:   Airway Management Planned: Natural Airway, Nasal Cannula and Simple Face Mask  Additional Equipment:   Intra-op Plan:   Post-operative Plan:   Informed Consent: I have reviewed the patients History and Physical, chart, labs and discussed the procedure including the risks, benefits and alternatives for the proposed anesthesia with the patient or authorized representative who has indicated his/her understanding and acceptance.     Dental advisory given  Plan Discussed with: CRNA  Anesthesia Plan Comments:         Anesthesia Quick Evaluation

## 2019-08-23 NOTE — Op Note (Signed)
[facility] Patient Name: Arthur Williamson Procedure Date: 08/23/2019 7:28 AM MRN: [id number] Date of Birth: October 17, 1956 Attending MD: Norvel Richards , MD CSN: [id number] Age: 63 Admit Type: Outpatient Procedure:                Colonoscopy Indications:              Screening for colorectal malignant neoplasm Providers:                Norvel Richards, MD, Hinton Rao, RN, Nelma Rothman, Technician Referring MD:             Redmond School, MD Medicines:                Propofol per Anesthesia Complications:            No immediate complications. Estimated Blood Loss:     Estimated blood loss was minimal. Procedure:                Pre-Anesthesia Assessment:                           - Prior to the procedure, a History and Physical                            was performed, and patient medications and                            allergies were reviewed. The patient's tolerance of                            previous anesthesia was also reviewed. The risks                            and benefits of the procedure and the sedation                            options and risks were discussed with the patient.                            All questions were answered, and informed consent                            was obtained. Prior Anticoagulants: The patient has                            taken no previous anticoagulant or antiplatelet                            agents. ASA Grade Assessment: II - A patient with                            mild systemic disease. After reviewing the risks  and benefits, the patient was deemed in                            satisfactory condition to undergo the procedure.                           After obtaining informed consent, the colonoscope                            was passed under direct vision. Throughout the                            procedure, the patient's blood pressure, pulse, and                     oxygen saturations were monitored continuously. The                            CF-HQ190L [id number]) scope was introduced through                            the anus and advanced to the the cecum, identified                            by appendiceal orifice and ileocecal valve. The                            colonoscopy was performed without difficulty. The                            patient tolerated the procedure well. The quality                            of the bowel preparation was adequate. The                            ileocecal valve, appendiceal orifice, and rectum                            were photographed. The entire colon was well                            visualized. Scope In: 7:39:37 AM Scope Out: 7:56:10 AM Scope Withdrawal Time: 0 hours 11 minutes 18 seconds  Total Procedure Duration: 0 hours 16 minutes 33 seconds  Findings:      Two sessile polyps were found in the sigmoid colon and hepatic flexure.       The polyps were 3 to 5 mm in size. These polyps were removed with a cold       snare. Resection and retrieval were complete. Estimated blood loss was       minimal.      Scattered medium-mouthed diverticula were found in the sigmoid colon.      The exam was otherwise without abnormality on direct and retroflexion       views. Impression:               -  Two 3 to 5 mm polyps in the sigmoid colon and at                            the hepatic flexure, removed with a cold snare.                            Resected and retrieved.                           - Diverticulosis in the sigmoid colon.                           - The examination was otherwise normal on direct                            and retroflexion views. Moderate Sedation:      Moderate (conscious) sedation was personally administered by an       anesthesia professional. The following parameters were monitored: oxygen       saturation, heart rate, blood pressure, respiratory rate, EKG,  adequacy       of pulmonary ventilation, and response to care. Recommendation:           - Patient has a contact number available for                            emergencies. The signs and symptoms of potential                            delayed complications were discussed with the                            patient. Return to normal activities tomorrow.                            Written discharge instructions were provided to the                            patient.                           - Continue present medications.                           - Repeat colonoscopy for surveillance based on                            pathology results.                           - Return to GI clinic (date not yet determined). Procedure Code(s):        --- Professional ---                           [phone], Colonoscopy, flexible; with removal of  tumor(s), polyp(s), or other lesion(s) by snare                            technique Diagnosis Code(s):        --- Professional ---                           Z12.11, Encounter for screening for malignant                            neoplasm of colon                           K63.5, Polyp of colon                           K57.30, Diverticulosis of large intestine without                            perforation or abscess without bleeding CPT copyright 2019 American Medical Association. All rights reserved. The codes documented in this report are preliminary and upon coder review may  be revised to meet current compliance requirements. Cristopher Estimable. Catina Nuss, MD Norvel Richards, MD 08/23/2019 8:06:36 AM This report has been signed electronically. Number of Addenda: 0

## 2019-08-25 ENCOUNTER — Encounter: Payer: Self-pay | Admitting: Internal Medicine

## 2019-08-27 ENCOUNTER — Encounter (HOSPITAL_COMMUNITY): Payer: Self-pay | Admitting: Internal Medicine

## 2019-10-04 DIAGNOSIS — E875 Hyperkalemia: Secondary | ICD-10-CM | POA: Diagnosis not present

## 2019-10-05 DIAGNOSIS — N281 Cyst of kidney, acquired: Secondary | ICD-10-CM | POA: Diagnosis not present

## 2019-10-08 DIAGNOSIS — N401 Enlarged prostate with lower urinary tract symptoms: Secondary | ICD-10-CM | POA: Diagnosis not present

## 2019-10-08 DIAGNOSIS — N281 Cyst of kidney, acquired: Secondary | ICD-10-CM | POA: Diagnosis not present

## 2019-10-08 DIAGNOSIS — N138 Other obstructive and reflux uropathy: Secondary | ICD-10-CM | POA: Diagnosis not present

## 2019-11-07 DIAGNOSIS — R7309 Other abnormal glucose: Secondary | ICD-10-CM | POA: Diagnosis not present

## 2020-01-14 DIAGNOSIS — Z0120 Encounter for dental examination and cleaning without abnormal findings: Secondary | ICD-10-CM | POA: Diagnosis not present

## 2020-02-10 ENCOUNTER — Ambulatory Visit: Payer: Medicare Other

## 2020-07-22 DIAGNOSIS — Z0120 Encounter for dental examination and cleaning without abnormal findings: Secondary | ICD-10-CM | POA: Diagnosis not present

## 2020-08-14 DIAGNOSIS — I781 Nevus, non-neoplastic: Secondary | ICD-10-CM | POA: Diagnosis not present

## 2020-08-14 DIAGNOSIS — B079 Viral wart, unspecified: Secondary | ICD-10-CM | POA: Diagnosis not present

## 2020-08-18 DIAGNOSIS — H35712 Central serous chorioretinopathy, left eye: Secondary | ICD-10-CM | POA: Diagnosis not present

## 2020-08-19 DIAGNOSIS — Z0001 Encounter for general adult medical examination with abnormal findings: Secondary | ICD-10-CM | POA: Diagnosis not present

## 2020-08-19 DIAGNOSIS — G4709 Other insomnia: Secondary | ICD-10-CM | POA: Diagnosis not present

## 2020-08-19 DIAGNOSIS — E063 Autoimmune thyroiditis: Secondary | ICD-10-CM | POA: Diagnosis not present

## 2020-08-19 DIAGNOSIS — N400 Enlarged prostate without lower urinary tract symptoms: Secondary | ICD-10-CM | POA: Diagnosis not present

## 2020-08-19 DIAGNOSIS — Z0000 Encounter for general adult medical examination without abnormal findings: Secondary | ICD-10-CM | POA: Diagnosis not present

## 2020-08-19 DIAGNOSIS — Z6831 Body mass index (BMI) 31.0-31.9, adult: Secondary | ICD-10-CM | POA: Diagnosis not present

## 2020-10-13 DIAGNOSIS — N281 Cyst of kidney, acquired: Secondary | ICD-10-CM | POA: Diagnosis not present

## 2020-10-13 DIAGNOSIS — N401 Enlarged prostate with lower urinary tract symptoms: Secondary | ICD-10-CM | POA: Diagnosis not present

## 2020-10-13 DIAGNOSIS — N200 Calculus of kidney: Secondary | ICD-10-CM | POA: Diagnosis not present

## 2020-10-13 DIAGNOSIS — N138 Other obstructive and reflux uropathy: Secondary | ICD-10-CM | POA: Diagnosis not present

## 2020-10-30 ENCOUNTER — Ambulatory Visit: Payer: Medicare Other | Attending: Internal Medicine

## 2020-10-30 DIAGNOSIS — Z23 Encounter for immunization: Secondary | ICD-10-CM

## 2020-10-30 NOTE — Progress Notes (Signed)
   Covid-19 Vaccination Clinic  Name:  AYDEEN BLUME    MRN: [id number] DOB: 1956/04/15  10/30/2020  Mr. Rosten was observed post Covid-19 immunization for 15 minutes without incident. He was provided with Vaccine Information Sheet and instruction to access the V-Safe system.   Mr. Leadbetter was instructed to call 911 with any severe reactions post vaccine: Marland Kitchen Difficulty breathing  . Swelling of face and throat  . A fast heartbeat  . A bad rash all over body  . Dizziness and weakness

## 2021-09-01 DIAGNOSIS — Z0000 Encounter for general adult medical examination without abnormal findings: Secondary | ICD-10-CM | POA: Diagnosis not present

## 2021-09-01 DIAGNOSIS — E063 Autoimmune thyroiditis: Secondary | ICD-10-CM | POA: Diagnosis not present

## 2021-09-01 DIAGNOSIS — Z0001 Encounter for general adult medical examination with abnormal findings: Secondary | ICD-10-CM | POA: Diagnosis not present

## 2021-09-01 DIAGNOSIS — Z6832 Body mass index (BMI) 32.0-32.9, adult: Secondary | ICD-10-CM | POA: Diagnosis not present

## 2021-09-01 DIAGNOSIS — E6609 Other obesity due to excess calories: Secondary | ICD-10-CM | POA: Diagnosis not present

## 2021-09-01 DIAGNOSIS — G4709 Other insomnia: Secondary | ICD-10-CM | POA: Diagnosis not present

## 2021-12-24 DIAGNOSIS — N281 Cyst of kidney, acquired: Secondary | ICD-10-CM | POA: Diagnosis not present

## 2021-12-24 DIAGNOSIS — N401 Enlarged prostate with lower urinary tract symptoms: Secondary | ICD-10-CM | POA: Diagnosis not present

## 2021-12-24 DIAGNOSIS — N200 Calculus of kidney: Secondary | ICD-10-CM | POA: Diagnosis not present

## 2021-12-24 DIAGNOSIS — N138 Other obstructive and reflux uropathy: Secondary | ICD-10-CM | POA: Diagnosis not present

## 2022-09-03 DIAGNOSIS — Z0001 Encounter for general adult medical examination with abnormal findings: Secondary | ICD-10-CM | POA: Diagnosis not present

## 2022-09-03 DIAGNOSIS — N401 Enlarged prostate with lower urinary tract symptoms: Secondary | ICD-10-CM | POA: Diagnosis not present

## 2022-09-03 DIAGNOSIS — I10 Essential (primary) hypertension: Secondary | ICD-10-CM | POA: Diagnosis not present

## 2022-09-03 DIAGNOSIS — E063 Autoimmune thyroiditis: Secondary | ICD-10-CM | POA: Diagnosis not present

## 2023-03-17 DIAGNOSIS — N200 Calculus of kidney: Secondary | ICD-10-CM | POA: Diagnosis not present

## 2023-03-17 DIAGNOSIS — N401 Enlarged prostate with lower urinary tract symptoms: Secondary | ICD-10-CM | POA: Diagnosis not present

## 2023-03-17 DIAGNOSIS — N138 Other obstructive and reflux uropathy: Secondary | ICD-10-CM | POA: Diagnosis not present

## 2023-03-17 DIAGNOSIS — N281 Cyst of kidney, acquired: Secondary | ICD-10-CM | POA: Diagnosis not present

## 2023-05-02 DIAGNOSIS — K080 Exfoliation of teeth due to systemic causes: Secondary | ICD-10-CM | POA: Diagnosis not present

## 2023-05-19 DIAGNOSIS — H5203 Hypermetropia, bilateral: Secondary | ICD-10-CM | POA: Diagnosis not present

## 2023-09-09 DIAGNOSIS — Z6832 Body mass index (BMI) 32.0-32.9, adult: Secondary | ICD-10-CM | POA: Diagnosis not present

## 2023-09-09 DIAGNOSIS — G4709 Other insomnia: Secondary | ICD-10-CM | POA: Diagnosis not present

## 2023-09-09 DIAGNOSIS — E6609 Other obesity due to excess calories: Secondary | ICD-10-CM | POA: Diagnosis not present

## 2023-09-09 DIAGNOSIS — E063 Autoimmune thyroiditis: Secondary | ICD-10-CM | POA: Diagnosis not present

## 2023-09-09 DIAGNOSIS — I10 Essential (primary) hypertension: Secondary | ICD-10-CM | POA: Diagnosis not present

## 2023-09-09 DIAGNOSIS — N401 Enlarged prostate with lower urinary tract symptoms: Secondary | ICD-10-CM | POA: Diagnosis not present

## 2023-09-09 DIAGNOSIS — Z0001 Encounter for general adult medical examination with abnormal findings: Secondary | ICD-10-CM | POA: Diagnosis not present

## 2023-09-09 DIAGNOSIS — Z1331 Encounter for screening for depression: Secondary | ICD-10-CM | POA: Diagnosis not present

## 2023-11-15 DIAGNOSIS — K080 Exfoliation of teeth due to systemic causes: Secondary | ICD-10-CM | POA: Diagnosis not present

## 2024-02-16 DIAGNOSIS — I10 Essential (primary) hypertension: Secondary | ICD-10-CM | POA: Diagnosis not present

## 2024-02-16 DIAGNOSIS — E6609 Other obesity due to excess calories: Secondary | ICD-10-CM | POA: Diagnosis not present

## 2024-02-16 DIAGNOSIS — G4709 Other insomnia: Secondary | ICD-10-CM | POA: Diagnosis not present

## 2024-02-16 DIAGNOSIS — Z6832 Body mass index (BMI) 32.0-32.9, adult: Secondary | ICD-10-CM | POA: Diagnosis not present

## 2024-03-29 DIAGNOSIS — N281 Cyst of kidney, acquired: Secondary | ICD-10-CM | POA: Diagnosis not present

## 2024-03-29 DIAGNOSIS — N138 Other obstructive and reflux uropathy: Secondary | ICD-10-CM | POA: Diagnosis not present

## 2024-03-29 DIAGNOSIS — N401 Enlarged prostate with lower urinary tract symptoms: Secondary | ICD-10-CM | POA: Diagnosis not present

## 2024-03-29 DIAGNOSIS — N200 Calculus of kidney: Secondary | ICD-10-CM | POA: Diagnosis not present

## 2024-06-07 DIAGNOSIS — K080 Exfoliation of teeth due to systemic causes: Secondary | ICD-10-CM | POA: Diagnosis not present

## 2024-07-05 ENCOUNTER — Encounter (INDEPENDENT_AMBULATORY_CARE_PROVIDER_SITE_OTHER): Payer: Self-pay | Admitting: *Deleted

## 2024-07-18 DIAGNOSIS — K080 Exfoliation of teeth due to systemic causes: Secondary | ICD-10-CM | POA: Diagnosis not present

## 2024-07-24 ENCOUNTER — Telehealth: Payer: Self-pay

## 2024-07-24 NOTE — Telephone Encounter (Signed)
 Who is your primary care physician: Dr.Fusco   Reasons for the colonoscopy: Hx polyps  Have you had a colonoscopy before?  Yes 08/23/2019  Do you have family history of colon cancer? no  Previous colonoscopy with polyps removed? yes  Do you have a history colorectal cancer?   no  Are you diabetic? If yes, Type 1 or Type 2?    no  Do you have a prosthetic or mechanical heart valve? no  Do you have a pacemaker/defibrillator?   no  Have you had endocarditis/atrial fibrillation? no  Have you had joint replacement within the last 12 months?  no  Do you tend to be constipated or have to use laxatives? no  Do you have any history of drugs or alchohol?  no  Do you use supplemental oxygen?  no  Have you had a stroke or heart attack within the last 6 months? no  Do you take weight loss medication?  no    Do you take any blood-thinning medications such as: (aspirin, warfarin, Plavix, Aggrenox)  no  If yes we need the name, milligram, dosage and who is prescribing doctor  Current Outpatient Medications on File Prior to Visit  Medication Sig Dispense Refill   ALPRAZolam (XANAX) 0.5 MG tablet Take 0.5-1 mg by mouth at bedtime as needed for sleep.      levothyroxine (SYNTHROID) 75 MCG tablet Take 75 mcg by mouth daily before breakfast.     loratadine (CLARITIN) 10 MG tablet Take 10 mg by mouth daily as needed for allergies.     losartan (COZAAR) 100 MG tablet Take 100 mg by mouth daily.     simvastatin (ZOCOR) 20 MG tablet Take 20 mg by mouth at bedtime.      tamsulosin (FLOMAX) 0.4 MG CAPS capsule Take 0.4 mg by mouth at bedtime.      No current facility-administered medications on file prior to visit.    No Known Allergies   Pharmacy: [facility] Name: [place] [phone]  Best number where you can be reached: [phone]

## 2024-08-24 NOTE — Telephone Encounter (Signed)
OK to schedule. ASA 2.  ?

## 2024-08-30 NOTE — Telephone Encounter (Signed)
 Called pt. Offerd 9/18 but he was not sure if he could. Advised will call once we get oct schedule

## 2024-09-06 ENCOUNTER — Encounter: Payer: Self-pay | Admitting: *Deleted

## 2024-09-06 ENCOUNTER — Other Ambulatory Visit: Payer: Self-pay | Admitting: *Deleted

## 2024-09-06 MED ORDER — PEG 3350-KCL-NA BICARB-NACL 420 G PO SOLR: 4000.0000 mL | Freq: Once | ORAL | 0 refills | Status: AC

## 2024-09-06 NOTE — Telephone Encounter (Signed)
LMOVM to call back to schedule 

## 2024-09-07 DIAGNOSIS — N401 Enlarged prostate with lower urinary tract symptoms: Secondary | ICD-10-CM | POA: Diagnosis not present

## 2024-09-07 DIAGNOSIS — E063 Autoimmune thyroiditis: Secondary | ICD-10-CM | POA: Diagnosis not present

## 2024-09-07 DIAGNOSIS — E782 Mixed hyperlipidemia: Secondary | ICD-10-CM | POA: Diagnosis not present

## 2024-09-07 DIAGNOSIS — I10 Essential (primary) hypertension: Secondary | ICD-10-CM | POA: Diagnosis not present

## 2024-09-27 ENCOUNTER — Encounter (HOSPITAL_COMMUNITY): Payer: Self-pay | Admitting: Internal Medicine

## 2024-09-27 ENCOUNTER — Ambulatory Visit (HOSPITAL_BASED_OUTPATIENT_CLINIC_OR_DEPARTMENT_OTHER): Admitting: Anesthesiology

## 2024-09-27 ENCOUNTER — Other Ambulatory Visit: Payer: Self-pay

## 2024-09-27 ENCOUNTER — Ambulatory Visit (HOSPITAL_COMMUNITY)
Admission: RE | Admit: 2024-09-27 | Discharge: 2024-09-27 | Disposition: A | Discharge status: Home / Self Care | Attending: Internal Medicine | Admitting: Internal Medicine

## 2024-09-27 ENCOUNTER — Ambulatory Visit (HOSPITAL_COMMUNITY): Admitting: Anesthesiology

## 2024-09-27 ENCOUNTER — Encounter (HOSPITAL_COMMUNITY)
Admission: RE | Disposition: A | Discharge status: Home / Self Care | Payer: Self-pay | Source: Home / Self Care | Attending: Internal Medicine

## 2024-09-27 DIAGNOSIS — Z1211 Encounter for screening for malignant neoplasm of colon: Secondary | ICD-10-CM | POA: Diagnosis not present

## 2024-09-27 DIAGNOSIS — K5730 Diverticulosis of large intestine without perforation or abscess without bleeding: Secondary | ICD-10-CM | POA: Insufficient documentation

## 2024-09-27 DIAGNOSIS — Z860100 Personal history of colon polyps, unspecified: Secondary | ICD-10-CM | POA: Diagnosis not present

## 2024-09-27 DIAGNOSIS — Z09 Encounter for follow-up examination after completed treatment for conditions other than malignant neoplasm: Secondary | ICD-10-CM | POA: Diagnosis present

## 2024-09-27 DIAGNOSIS — Z87891 Personal history of nicotine dependence: Secondary | ICD-10-CM | POA: Diagnosis not present

## 2024-09-27 DIAGNOSIS — E039 Hypothyroidism, unspecified: Secondary | ICD-10-CM | POA: Insufficient documentation

## 2024-09-27 DIAGNOSIS — Z79899 Other long term (current) drug therapy: Secondary | ICD-10-CM | POA: Diagnosis not present

## 2024-09-27 DIAGNOSIS — I10 Essential (primary) hypertension: Secondary | ICD-10-CM | POA: Insufficient documentation

## 2024-09-27 DIAGNOSIS — G4730 Sleep apnea, unspecified: Secondary | ICD-10-CM | POA: Diagnosis not present

## 2024-09-27 HISTORY — PX: COLONOSCOPY: SHX5424

## 2024-09-27 SURGERY — COLONOSCOPY
Anesthesia: General

## 2024-09-27 MED ORDER — LACTATED RINGERS IV SOLN: INTRAVENOUS | Status: DC

## 2024-09-27 MED ORDER — LACTATED RINGERS IV SOLN
INTRAVENOUS | Status: DC
Start: 2024-09-27 — End: 2024-09-27

## 2024-09-27 MED ORDER — PROPOFOL 10 MG/ML IV BOLUS
INTRAVENOUS | Status: DC | PRN
  Administered 2024-09-27: 100 mg via INTRAVENOUS
  Administered 2024-09-27 (×4): 50 mg via INTRAVENOUS
  Administered 2024-09-27 (×3): 100 mg via INTRAVENOUS

## 2024-09-27 MED ORDER — LIDOCAINE HCL (CARDIAC) PF 100 MG/5ML IV SOSY
PREFILLED_SYRINGE | INTRAVENOUS | Status: DC | PRN
  Administered 2024-09-27: 50 mg via INTRAVENOUS

## 2024-09-27 NOTE — Op Note (Signed)
 [facility] Patient Name: Arthur Williamson Procedure Date: 09/27/2024 8:30 AM MRN: [id number] Date of Birth: 30-Mar-1956 Attending MD: Lamar Ozell Hollingshead , MD, [id number] CSN: [id number] Age: 68 Admit Type: Outpatient Procedure:                Colonoscopy Indications:              High risk colon cancer surveillance: Personal                            history of colonic polyps Providers:                Lamar Ozell Hollingshead, MD, Jon LABOR. Gerome RN, RN,                            Italy Wilson, Technician Referring MD:              Medicines:                Propofol  per Anesthesia Complications:            No immediate complications. Estimated Blood Loss:     Estimated blood loss: none. Procedure:                Pre-Anesthesia Assessment:                           - Prior to the procedure, a History and Physical                            was performed, and patient medications and                            allergies were reviewed. The patient's tolerance of                            previous anesthesia was also reviewed. The risks                            and benefits of the procedure and the sedation                            options and risks were discussed with the patient.                            All questions were answered, and informed consent                            was obtained. Prior Anticoagulants: The patient has                            taken no anticoagulant or antiplatelet agents. ASA                            Grade Assessment: III - A patient with severe  systemic disease. After reviewing the risks and                            benefits, the patient was deemed in satisfactory                            condition to undergo the procedure.                           After obtaining informed consent, the colonoscope                            was passed under direct vision. Throughout the                            procedure, the  patient's blood pressure, pulse, and                            oxygen saturations were monitored continuously. The                            CF-HQ190L ([id number]) Colon was introduced through                            the anus and advanced to the the cecum, identified                            by appendiceal orifice and ileocecal valve. The                            colonoscopy was performed without difficulty. The                            patient tolerated the procedure well. The quality                            of the bowel preparation was adequate. The                            ileocecal valve, appendiceal orifice, and rectum                            were photographed. Scope In: 8:55:04 AM Scope Out: 9:09:36 AM Scope Withdrawal Time: 0 hours 9 minutes 6 seconds  Total Procedure Duration: 0 hours 14 minutes 32 seconds  Findings:      The perianal and digital rectal examinations were normal.      Scattered diverticula were found in the sigmoid colon and descending       colon.      The exam was otherwise without abnormality on direct and retroflexion       views. Impression:               - Diverticulosis in the sigmoid colon and in the  descending colon.                           - The examination was otherwise normal on direct                            and retroflexion views.                           - No specimens collected. Moderate Sedation:      Moderate (conscious) sedation was personally administered by an       anesthesia professional. The following parameters were monitored: oxygen       saturation, heart rate, blood pressure, respiratory rate, EKG, adequacy       of pulmonary ventilation, and response to care. Recommendation:           - No repeat colonoscopy due to age.                           - Continue present medications.                           - Return to my office PRN. Procedure Code(s):        --- Professional ---                            [phone], Colonoscopy, flexible; diagnostic, including                            collection of specimen(s) by brushing or washing,                            when performed (separate procedure) Diagnosis Code(s):        --- Professional ---                           Z86.010, Personal history of colonic polyps                           K57.30, Diverticulosis of large intestine without                            perforation or abscess without bleeding CPT copyright 2022 American Medical Association. All rights reserved. The codes documented in this report are preliminary and upon coder review may  be revised to meet current compliance requirements. Lamar HERO. Dracen Reigle, MD Lamar Ozell Hollingshead, MD 09/27/2024 9:22:00 AM This report has been signed electronically. Number of Addenda: 0

## 2024-09-27 NOTE — H&P (Signed)
 @LOGO @   Gastroenterology Progress Note    Primary Care Physician:  Leonce Lucie PARAS, PA-C Primary Gastroenterologist:  Dr. Shaaron  Pre-Procedure History & Physical: HPI:  Arthur Williamson is a 68 y.o. male here for surveillance TCS. Hx of polyps  Past Medical History:  Diagnosis Date   BPH (benign prostatic hyperplasia)    Hypercholesterolemia    Hypertension    Hypothyroidism    Insomnia     Past Surgical History:  Procedure Laterality Date   BACK SURGERY  1990   COLONOSCOPY WITH PROPOFOL  N/A 08/23/2019   Procedure: COLONOSCOPY WITH PROPOFOL ;  Surgeon: Shaaron Lamar HERO, MD;  Location: [facility];  Service: Endoscopy;  Laterality: N/A;  8:30am   INCISION AND DRAINAGE ABSCESS ANAL     perianal abscess drainage   POLYPECTOMY  08/23/2019   Procedure: POLYPECTOMY;  Surgeon: Shaaron Lamar HERO, MD;  Location: [facility];  Service: Endoscopy;;   PYLORIC VALVE SURGERY     As an infant    Prior to Admission medications   Medication Sig Start Date End Date Taking? Authorizing Provider  ALPRAZolam (XANAX) 0.5 MG tablet Take 0.5-1 mg by mouth at bedtime as needed for sleep.    Yes [provider]  levothyroxine (SYNTHROID) 75 MCG tablet Take 75 mcg by mouth daily before breakfast.   Yes [provider]  losartan (COZAAR) 100 MG tablet Take 100 mg by mouth daily.   Yes [provider]  simvastatin (ZOCOR) 20 MG tablet Take 20 mg by mouth at bedtime.  04/30/19  Yes [provider]  tamsulosin (FLOMAX) 0.4 MG CAPS capsule Take 0.4 mg by mouth at bedtime.  03/07/19  Yes [provider]  loratadine (CLARITIN) 10 MG tablet Take 10 mg by mouth daily as needed for allergies.    [provider]    Allergies as of 09/06/2024   (No Known Allergies)    Family History  Problem Relation Age of Onset   Colon cancer Neg Hx     Social History   Socioeconomic History   Marital status: Married    Spouse name: Not on file   Number of  children: Not on file   Years of education: Not on file   Highest education level: Not on file  Occupational History   Not on file  Tobacco Use   Smoking status: Former    Current packs/day: 0.00    Average packs/day: 1 pack/day for 25.0 years (25.0 ttl pk-yrs)    Types: Cigarettes    Start date: 12/27/1969    Quit date: 05/13/1994    Years since quitting: 30.3   Smokeless tobacco: Never  Vaping Use   Vaping status: Never Used  Substance and Sexual Activity   Alcohol use: Yes    Comment: 6 beers/day   Drug use: Yes    Frequency: 7.0 times per week    Types: Marijuana    Comment: last monday   Sexual activity: Yes  Other Topics Concern   Not on file  Social History Narrative   Not on file   Social Drivers of Health   Financial Resource Strain: Not on file  Food Insecurity: Low Risk  (03/29/2024)   Received from [facility]   Hunger Vital Sign    Within the past 12 months, you worried that your food would run out before you got money to buy more: Never true    Within the past 12 months, the food you bought just didn't last and  you didn't have money to get more. : Never true  Transportation Needs: No Transportation Needs (03/29/2024)   Received from [facility]    In the past 12 months, has lack of reliable transportation kept you from medical appointments, meetings, work or from getting things needed for daily living? : No  Physical Activity: Not on file  Stress: Not on file  Social Connections: Not on file  Intimate Partner Violence: Not on file    Review of Systems   See HPI, otherwise negative ROS  Physical Exam: BP (!) 178/99   Pulse 66   Temp 98.6 F (37 C) (Oral)   Resp 14   Ht 5' 10 (1.778 m)   Wt 99.8 kg   SpO2 98%   BMI 31.57 kg/m  General:   Alert,  Well-developed, well-nourished, pleasant and cooperative in NAD Skin:  Intact without significant lesions or rashes. Eyes:  Sclera clear, no icterus.   Conjunctiva pink. Ears:  Normal  auditory acuity. Nose:  No deformity, discharge,  or lesions. Mouth:  No deformity or lesions. Neck:  Supple; no masses or thyromegaly. No significant cervical adenopathy. Lungs:  Clear throughout to auscultation.   No wheezes, crackles, or rhonchi. No acute distress. Heart:  Regular rate and rhythm; no murmurs, clicks, rubs,  or gallops. Abdomen: Non-distended, normal bowel sounds.  Soft and nontender without appreciable mass or hepatosplenomegaly.  Pulses:  Normal pulses noted. Extremities:  Without clubbing or edema.   Impression/Plan:    Hx of poyps. TCS today.  The risks, benefits, limitations, alternatives and imponderables have been reviewed with the patient. Questions have been answered. All parties are agreeable.     Notice: This dictation was prepared with Dragon dictation along with smaller phrase technology. Any transcriptional errors that result from this process are unintentional and may not be corrected upon review.

## 2024-09-27 NOTE — Transfer of Care (Signed)
 Immediate Anesthesia Transfer of Care Note  Patient: Arthur Williamson  Procedure(s) Performed: COLONOSCOPY  Patient Location: Endoscopy Unit  Anesthesia Type:General  Level of Consciousness: drowsy, patient cooperative, and responds to stimulation  Airway & Oxygen Therapy: Patient Spontanous Breathing  Post-op Assessment: Report given to RN and Post -op Vital signs reviewed and stable  Post vital signs: Reviewed and stable  Last Vitals:  Vitals Value Taken Time  BP 126/80 09/27/24 09:11  Temp 36.9 C 09/27/24 09:11  Pulse 73 09/27/24 09:11  Resp 16 09/27/24 09:11  SpO2 96 % 09/27/24 09:11    Last Pain:  Vitals:   09/27/24 0911  TempSrc: Oral  PainSc: 0-No pain      Patients Stated Pain Goal: 3 (09/27/24 0804)  Complications: No notable events documented.

## 2024-09-27 NOTE — Discharge Instructions (Addendum)
  Colonoscopy Discharge Instructions  Read the instructions outlined below and refer to this sheet in the next few weeks. These discharge instructions provide you with general information on caring for yourself after you leave the hospital. Your doctor may also give you specific instructions. While your treatment has been planned according to the most current medical practices available, unavoidable complications occasionally occur. If you have any problems or questions after discharge, call Dr. Shaaron at [phone]. ACTIVITY You may resume your regular activity, but move at a slower pace for the next 24 hours.  Take frequent rest periods for the next 24 hours.  Walking will help get rid of the air and reduce the bloated feeling in your belly (abdomen).  No driving for 24 hours (because of the medicine (anesthesia) used during the test).   Do not sign any important legal documents or operate any machinery for 24 hours (because of the anesthesia used during the test).  NUTRITION Drink plenty of fluids.  You may resume your normal diet as instructed by your doctor.  Begin with a light meal and progress to your normal diet. Heavy or fried foods are harder to digest and may make you feel sick to your stomach (nauseated).  Avoid alcoholic beverages for 24 hours or as instructed.  MEDICATIONS You may resume your normal medications unless your doctor tells you otherwise.  WHAT YOU CAN EXPECT TODAY Some feelings of bloating in the abdomen.  Passage of more gas than usual.  Spotting of blood in your stool or on the toilet paper.  IF YOU HAD POLYPS REMOVED DURING THE COLONOSCOPY: No aspirin products for 7 days or as instructed.  No alcohol for 7 days or as instructed.  Eat a soft diet for the next 24 hours.  FINDING OUT THE RESULTS OF YOUR TEST Not all test results are available during your visit. If your test results are not back during the visit, make an appointment with your caregiver to find out the  results. Do not assume everything is normal if you have not heard from your caregiver or the medical facility. It is important for you to follow up on all of your test results.  SEEK IMMEDIATE MEDICAL ATTENTION IF: You have more than a spotting of blood in your stool.  Your belly is swollen (abdominal distention).  You are nauseated or vomiting.  You have a temperature over 101.  You have abdominal pain or discomfort that is severe or gets worse throughout the day.     No polyps found today; diverticulosis only  A future colonoscopy is not recommended unless new symptoms develop.

## 2024-09-27 NOTE — Anesthesia Postprocedure Evaluation (Signed)
 Anesthesia Post Note  Patient: Arthur Williamson  Procedure(s) Performed: COLONOSCOPY  Patient location during evaluation: Endoscopy Anesthesia Type: General Level of consciousness: awake and alert Pain management: pain level controlled Vital Signs Assessment: post-procedure vital signs reviewed and stable Respiratory status: spontaneous breathing, nonlabored ventilation and respiratory function stable Cardiovascular status: stable Anesthetic complications: no   There were no known notable events for this encounter.   Last Vitals:  Vitals:   09/27/24 0804 09/27/24 0911  BP: (!) 178/99 126/80  Pulse: 66 73  Resp: 14 16  Temp: 37 C 36.9 C  SpO2: 98% 96%    Last Pain:  Vitals:   09/27/24 0911  TempSrc: Oral  PainSc: 0-No pain                 Boleslaw Borghi L Ivaan Liddy

## 2024-09-27 NOTE — Anesthesia Preprocedure Evaluation (Signed)
 Anesthesia Evaluation  Patient identified by MRN, date of birth, ID band Patient awake    Reviewed: Allergy & Precautions, H&P , NPO status , Patient's Chart, lab work & pertinent test results  Airway Mallampati: III  TM Distance: >3 FB Neck ROM: Full    Dental  (+) Caps, Dental Advisory Given   Pulmonary neg pulmonary ROS, Sleep apnea: snoring. , former smoker          Cardiovascular hypertension, Pt. on medications Normal cardiovascular exam     Neuro/Psych negative neurological ROS  negative psych ROS   GI/Hepatic Neg liver ROS,GERD (mild)  ,,  Endo/Other  Hypothyroidism    Renal/GU negative Renal ROS  negative genitourinary   Musculoskeletal negative musculoskeletal ROS (+)    Abdominal   Peds negative pediatric ROS (+)  Hematology negative hematology ROS (+)   Anesthesia Other Findings   Reproductive/Obstetrics negative OB ROS                              Anesthesia Physical Anesthesia Plan  ASA: 2  Anesthesia Plan: General   Post-op Pain Management: Minimal or no pain anticipated   Induction: Intravenous  PONV Risk Score and Plan: Propofol  infusion  Airway Management Planned: Natural Airway and Nasal Cannula  Additional Equipment: None  Intra-op Plan:   Post-operative Plan:   Informed Consent: I have reviewed the patients History and Physical, chart, labs and discussed the procedure including the risks, benefits and alternatives for the proposed anesthesia with the patient or authorized representative who has indicated his/her understanding and acceptance.     Dental advisory given  Plan Discussed with: CRNA  Anesthesia Plan Comments:          Anesthesia Quick Evaluation

## 2024-10-04 ENCOUNTER — Encounter (HOSPITAL_COMMUNITY): Payer: Self-pay | Admitting: Internal Medicine
